# Patient Record
Sex: Female | Born: 1960 | Race: White | Hispanic: No | Marital: Married | Smoking: Never smoker
Health system: Southern US, Community
[De-identification: ages and names within clinical notes are randomized; demographics above are authoritative.]

## PROBLEM LIST (undated history)

## (undated) HISTORY — PX: CHOLECYSTECTOMY: SHX55

---

## 2005-01-04 ENCOUNTER — Ambulatory Visit: Payer: Self-pay

## 2016-05-01 ENCOUNTER — Other Ambulatory Visit: Payer: Self-pay | Admitting: Family Medicine

## 2016-05-02 ENCOUNTER — Other Ambulatory Visit: Payer: Self-pay | Admitting: Family Medicine

## 2016-05-02 DIAGNOSIS — Z1231 Encounter for screening mammogram for malignant neoplasm of breast: Secondary | ICD-10-CM

## 2016-05-22 ENCOUNTER — Ambulatory Visit
Admission: RE | Admit: 2016-05-22 | Discharge: 2016-05-22 | Disposition: A | Discharge status: Home / Self Care | Payer: BC Managed Care – PPO | Source: Ambulatory Visit | Attending: Family Medicine | Admitting: Family Medicine

## 2016-05-22 DIAGNOSIS — Z1231 Encounter for screening mammogram for malignant neoplasm of breast: Secondary | ICD-10-CM | POA: Diagnosis present

## 2017-04-25 ENCOUNTER — Other Ambulatory Visit: Payer: Self-pay | Admitting: Family Medicine

## 2017-04-27 ENCOUNTER — Other Ambulatory Visit: Payer: Self-pay | Admitting: Family Medicine

## 2017-04-27 DIAGNOSIS — Z1231 Encounter for screening mammogram for malignant neoplasm of breast: Secondary | ICD-10-CM

## 2017-05-23 ENCOUNTER — Encounter: Payer: Self-pay | Admitting: Radiology

## 2017-05-23 ENCOUNTER — Ambulatory Visit
Admission: RE | Admit: 2017-05-23 | Discharge: 2017-05-23 | Disposition: A | Discharge status: Home / Self Care | Payer: BC Managed Care – PPO | Source: Ambulatory Visit | Attending: Family Medicine | Admitting: Family Medicine

## 2017-05-23 DIAGNOSIS — Z1231 Encounter for screening mammogram for malignant neoplasm of breast: Secondary | ICD-10-CM | POA: Insufficient documentation

## 2018-05-30 ENCOUNTER — Other Ambulatory Visit: Payer: Self-pay | Admitting: Family Medicine

## 2018-05-30 DIAGNOSIS — Z1231 Encounter for screening mammogram for malignant neoplasm of breast: Secondary | ICD-10-CM

## 2018-07-10 ENCOUNTER — Other Ambulatory Visit: Payer: Self-pay

## 2018-07-10 ENCOUNTER — Ambulatory Visit
Admission: RE | Admit: 2018-07-10 | Discharge: 2018-07-10 | Disposition: A | Discharge status: Home / Self Care | Payer: BC Managed Care – PPO | Source: Ambulatory Visit | Attending: Family Medicine | Admitting: Family Medicine

## 2018-07-10 DIAGNOSIS — Z1231 Encounter for screening mammogram for malignant neoplasm of breast: Secondary | ICD-10-CM | POA: Diagnosis not present

## 2019-02-19 ENCOUNTER — Encounter: Payer: Self-pay | Admitting: Ophthalmology

## 2019-02-21 NOTE — Discharge Instructions (Signed)

## 2019-02-24 ENCOUNTER — Other Ambulatory Visit
Admission: RE | Admit: 2019-02-24 | Discharge: 2019-02-24 | Disposition: A | Discharge status: Home / Self Care | Payer: BC Managed Care – PPO | Source: Ambulatory Visit | Attending: Ophthalmology | Admitting: Ophthalmology

## 2019-02-24 ENCOUNTER — Other Ambulatory Visit: Payer: Self-pay

## 2019-02-24 DIAGNOSIS — Z01812 Encounter for preprocedural laboratory examination: Secondary | ICD-10-CM | POA: Insufficient documentation

## 2019-02-24 DIAGNOSIS — Z20822 Contact with and (suspected) exposure to covid-19: Secondary | ICD-10-CM | POA: Insufficient documentation

## 2019-02-25 LAB — SARS CORONAVIRUS 2 (TAT 6-24 HRS): SARS Coronavirus 2: NEGATIVE

## 2019-02-26 ENCOUNTER — Ambulatory Visit: Payer: BC Managed Care – PPO | Admitting: Anesthesiology

## 2019-02-26 ENCOUNTER — Ambulatory Visit
Admission: RE | Admit: 2019-02-26 | Discharge: 2019-02-26 | Disposition: A | Discharge status: Home / Self Care | Payer: BC Managed Care – PPO | Attending: Ophthalmology | Admitting: Ophthalmology

## 2019-02-26 ENCOUNTER — Encounter
Admission: RE | Disposition: A | Discharge status: Home / Self Care | Payer: Self-pay | Source: Home / Self Care | Attending: Ophthalmology

## 2019-02-26 ENCOUNTER — Other Ambulatory Visit: Payer: Self-pay

## 2019-02-26 ENCOUNTER — Encounter: Payer: Self-pay | Admitting: Ophthalmology

## 2019-02-26 DIAGNOSIS — H2511 Age-related nuclear cataract, right eye: Secondary | ICD-10-CM | POA: Insufficient documentation

## 2019-02-26 DIAGNOSIS — Z9049 Acquired absence of other specified parts of digestive tract: Secondary | ICD-10-CM | POA: Insufficient documentation

## 2019-02-26 HISTORY — PX: CATARACT EXTRACTION W/PHACO: SHX586

## 2019-02-26 SURGERY — PHACOEMULSIFICATION, CATARACT, WITH IOL INSERTION
Anesthesia: Monitor Anesthesia Care | Site: Eye | Laterality: Right

## 2019-02-26 MED ORDER — FENTANYL CITRATE (PF) 100 MCG/2ML IJ SOLN
INTRAMUSCULAR | Status: DC | PRN
  Administered 2019-02-26 (×2): 50 ug via INTRAVENOUS

## 2019-02-26 MED ORDER — LIDOCAINE HCL (PF) 2 % IJ SOLN
INTRAOCULAR | Status: DC | PRN
  Administered 2019-02-26: 10:00:00 1 mL

## 2019-02-26 MED ORDER — TETRACAINE HCL 0.5 % OP SOLN
1.0000 [drp] | OPHTHALMIC | Status: DC | PRN
  Administered 2019-02-26 (×3): 1 [drp] via OPHTHALMIC

## 2019-02-26 MED ORDER — MIDAZOLAM HCL 2 MG/2ML IJ SOLN
INTRAMUSCULAR | Status: DC | PRN
  Administered 2019-02-26 (×2): 1 mg via INTRAVENOUS

## 2019-02-26 MED ORDER — CEFUROXIME OPHTHALMIC INJECTION 1 MG/0.1 ML
INJECTION | OPHTHALMIC | Status: DC | PRN
  Administered 2019-02-26: 0.1 mL via INTRACAMERAL

## 2019-02-26 MED ORDER — EPINEPHRINE PF 1 MG/ML IJ SOLN
INTRAOCULAR | Status: DC | PRN
  Administered 2019-02-26: 10:00:00 44 mL via OPHTHALMIC

## 2019-02-26 MED ORDER — MOXIFLOXACIN HCL 0.5 % OP SOLN
1.0000 [drp] | OPHTHALMIC | Status: DC | PRN
  Administered 2019-02-26 (×3): 1 [drp] via OPHTHALMIC

## 2019-02-26 MED ORDER — BRIMONIDINE TARTRATE-TIMOLOL 0.2-0.5 % OP SOLN
OPHTHALMIC | Status: DC | PRN
  Administered 2019-02-26: 1 [drp] via OPHTHALMIC

## 2019-02-26 MED ORDER — NA HYALUR & NA CHOND-NA HYALUR 0.4-0.35 ML IO KIT
PACK | INTRAOCULAR | Status: DC | PRN
  Administered 2019-02-26: 1 mL via INTRAOCULAR

## 2019-02-26 MED ORDER — ARMC OPHTHALMIC DILATING DROPS
1.0000 "application " | OPHTHALMIC | Status: DC | PRN
  Administered 2019-02-26 (×3): 1 via OPHTHALMIC

## 2019-02-26 SURGICAL SUPPLY — 29 items
CANNULA ANT/CHMB 27G (MISCELLANEOUS) ×1 IMPLANT
CANNULA ANT/CHMB 27GA (MISCELLANEOUS) ×3 IMPLANT
GLOVE SURG LX 7.5 STRW (GLOVE) ×4
GLOVE SURG LX STRL 7.5 STRW (GLOVE) ×1 IMPLANT
GLOVE SURG TRIUMPH 8.0 PF LTX (GLOVE) ×3 IMPLANT
GOWN STRL REUS W/ TWL LRG LVL3 (GOWN DISPOSABLE) ×2 IMPLANT
GOWN STRL REUS W/TWL LRG LVL3 (GOWN DISPOSABLE) ×4
LENS IOL TECNIS ITEC 28.0 (Intraocular Lens) ×2 IMPLANT
MARKER SKIN DUAL TIP RULER LAB (MISCELLANEOUS) ×3 IMPLANT
NDL CAPSULORHEX 25GA (NEEDLE) ×1 IMPLANT
NDL FILTER BLUNT 18X1 1/2 (NEEDLE) ×2 IMPLANT
NDL RETROBULBAR .5 NSTRL (NEEDLE) IMPLANT
NEEDLE CAPSULORHEX 25GA (NEEDLE) ×3 IMPLANT
NEEDLE FILTER BLUNT 18X 1/2SAF (NEEDLE) ×4
NEEDLE FILTER BLUNT 18X1 1/2 (NEEDLE) ×2 IMPLANT
PACK CATARACT BRASINGTON (MISCELLANEOUS) ×3 IMPLANT
PACK EYE AFTER SURG (MISCELLANEOUS) ×3 IMPLANT
PACK OPTHALMIC (MISCELLANEOUS) ×3 IMPLANT
RING MALYGIN 7.0 (MISCELLANEOUS) IMPLANT
SOLUTION OPHTHALMIC SALT (MISCELLANEOUS) ×3 IMPLANT
SUT ETHILON 10-0 CS-B-6CS-B-6 (SUTURE)
SUT VICRYL  9 0 (SUTURE)
SUT VICRYL 9 0 (SUTURE) IMPLANT
SUTURE EHLN 10-0 CS-B-6CS-B-6 (SUTURE) IMPLANT
SYR 3ML LL SCALE MARK (SYRINGE) ×6 IMPLANT
SYR TB 1ML LUER SLIP (SYRINGE) ×3 IMPLANT
WATER STERILE IRR 250ML POUR (IV SOLUTION) ×3 IMPLANT
WICK EYE OCUCEL (MISCELLANEOUS) ×2 IMPLANT
WIPE NON LINTING 3.25X3.25 (MISCELLANEOUS) ×3 IMPLANT

## 2019-02-26 NOTE — Anesthesia Preprocedure Evaluation (Signed)
Anesthesia Evaluation  Patient identified by MRN, date of birth, ID band Patient awake    Reviewed: Allergy & Precautions, NPO status , Patient's Chart, lab work & pertinent test results  Airway Mallampati: II  TM Distance: >3 FB Neck ROM: Full    Dental no notable dental hx.    Pulmonary neg pulmonary ROS,    Pulmonary exam normal breath sounds clear to auscultation       Cardiovascular negative cardio ROS Normal cardiovascular exam Rhythm:Regular Rate:Normal     Neuro/Psych cataract negative psych ROS   GI/Hepatic negative GI ROS, Neg liver ROS,   Endo/Other  negative endocrine ROS  Renal/GU negative Renal ROS  negative genitourinary   Musculoskeletal negative musculoskeletal ROS (+)   Abdominal Normal abdominal exam  (+)   Peds negative pediatric ROS (+)  Hematology negative hematology ROS (+)   Anesthesia Other Findings   Reproductive/Obstetrics negative OB ROS                             Anesthesia Physical Anesthesia Plan  ASA: I  Anesthesia Plan: MAC   Post-op Pain Management:    Induction: Intravenous  PONV Risk Score and Plan: 2 and Ondansetron and Treatment may vary due to age or medical condition  Airway Management Planned: Nasal Cannula  Additional Equipment:   Intra-op Plan:   Post-operative Plan:   Informed Consent: I have reviewed the patients History and Physical, chart, labs and discussed the procedure including the risks, benefits and alternatives for the proposed anesthesia with the patient or authorized representative who has indicated his/her understanding and acceptance.       Plan Discussed with: CRNA  Anesthesia Plan Comments:         Anesthesia Quick Evaluation

## 2019-02-26 NOTE — H&P (Signed)

## 2019-02-26 NOTE — Transfer of Care (Signed)
Immediate Anesthesia Transfer of Care Note  Patient: Sierra Osborne  Procedure(s) Performed: CATARACT EXTRACTION PHACO AND INTRAOCULAR LENS PLACEMENT (IOC) RIGHT (Right Eye)  Patient Location: PACU  Anesthesia Type: MAC  Level of Consciousness: awake, alert  and patient cooperative  Airway and Oxygen Therapy: Patient Spontanous Breathing   Post-op Assessment: Post-op Vital signs reviewed, Patient's Cardiovascular Status Stable, Respiratory Function Stable, Patent Airway and No signs of Nausea or vomiting  Post-op Vital Signs: Reviewed and stable  Complications: No apparent anesthesia complications

## 2019-02-26 NOTE — Op Note (Signed)
LOCATION:  [facility]   PREOPERATIVE DIAGNOSIS:    Nuclear sclerotic cataract right eye. H25.11   POSTOPERATIVE DIAGNOSIS:  Nuclear sclerotic cataract right eye.     PROCEDURE:  Phacoemusification with posterior chamber intraocular lens placement of the right eye   ULTRASOUND TIME: Procedure(s) with comments: CATARACT EXTRACTION PHACO AND INTRAOCULAR LENS PLACEMENT (IOC) RIGHT (Right) - CDE 10.57 U/S 0:56.8 FP3 45.8%  LENS:   Implant Name Type Inv. Item Serial No. Manufacturer Lot No. LRB No. Used Action  LENS IOL DIOP 28.0 - [id number] Intraocular Lens LENS IOL DIOP 28.0 [id number]  Right 1 Implanted         SURGEON:  Deirdre Evener, MD   ANESTHESIA:  Topical with tetracaine drops and 2% Xylocaine jelly, augmented with 1% preservative-free intracameral lidocaine.    COMPLICATIONS:  None.   DESCRIPTION OF PROCEDURE:  The patient was identified in the holding room and transported to the operating room and placed in the supine position under the operating microscope.  The right eye was identified as the operative eye and it was prepped and draped in the usual sterile ophthalmic fashion.   A 1 millimeter clear-corneal paracentesis was made at the 12:00 position.  0.5 ml of preservative-free 1% lidocaine was injected into the anterior chamber. The anterior chamber was filled with Viscoat viscoelastic.  A 2.4 millimeter keratome was used to make a near-clear corneal incision at the 9:00 position.  A curvilinear capsulorrhexis was made with a cystotome and capsulorrhexis forceps.  Balanced salt solution was used to hydrodissect and hydrodelineate the nucleus.   Phacoemulsification was then used in stop and chop fashion to remove the lens nucleus and epinucleus.  The remaining cortex was then removed using the irrigation and aspiration handpiece. Provisc was then placed into the capsular bag to distend it for lens placement.  A lens was then injected into the capsular  bag.  The remaining viscoelastic was aspirated.   Wounds were hydrated with balanced salt solution.  The anterior chamber was inflated to a physiologic pressure with balanced salt solution.  No wound leaks were noted. Cefuroxime 0.1 ml of a 10mg /ml solution was injected into the anterior chamber for a dose of 1 mg of intracameral antibiotic at the completion of the case.   Timolol and Brimonidine drops were applied to the eye.  The patient was taken to the recovery room in stable condition without complications of anesthesia or surgery.   Sierra Osborne 02/26/2019, 9:59 AM

## 2019-02-26 NOTE — Anesthesia Postprocedure Evaluation (Signed)
Anesthesia Post Note  Patient: Sierra Osborne  Procedure(s) Performed: CATARACT EXTRACTION PHACO AND INTRAOCULAR LENS PLACEMENT (IOC) RIGHT (Right Eye)     Anesthesia Post Evaluation  Emmary Culbreath

## 2019-02-26 NOTE — Anesthesia Procedure Notes (Signed)
Procedure Name: MAC Date/Time: 02/26/2019 9:42 AM Performed by: Vanetta Shawl, CRNA Pre-anesthesia Checklist: Patient identified, Emergency Drugs available, Suction available, Timeout performed and Patient being monitored Patient Re-evaluated:Patient Re-evaluated prior to induction Oxygen Delivery Method: Nasal cannula Placement Confirmation: positive ETCO2

## 2019-02-27 ENCOUNTER — Encounter: Payer: Self-pay | Admitting: *Deleted

## 2019-03-08 ENCOUNTER — Ambulatory Visit: Payer: BC Managed Care – PPO | Attending: Internal Medicine

## 2019-03-08 DIAGNOSIS — Z23 Encounter for immunization: Secondary | ICD-10-CM | POA: Insufficient documentation

## 2019-03-08 NOTE — Progress Notes (Signed)
   Covid-19 Vaccination Clinic  Name:  Sierra Osborne    MRN: [id number] DOB: 1960/02/13  03/08/2019  Sierra Osborne was observed post Covid-19 immunization for 15 minutes without incident. She was provided with Vaccine Information Sheet and instruction to access the V-Safe system.   Sierra Osborne was instructed to call 911 with any severe reactions post vaccine: Marland Kitchen Difficulty breathing  . Swelling of face and throat  . A fast heartbeat  . A bad rash all over body  . Dizziness and weakness   Immunizations Administered    Name Date Dose VIS Date Route   Moderna COVID-19 Vaccine 03/08/2019  2:05 PM 0.5 mL 12/03/2018 Intramuscular   Manufacturer: Moderna   Lot: [id number]   NDC: 38177-116-57

## 2019-03-11 ENCOUNTER — Encounter: Payer: Self-pay | Admitting: Ophthalmology

## 2019-03-17 ENCOUNTER — Other Ambulatory Visit
Admission: RE | Admit: 2019-03-17 | Discharge: 2019-03-17 | Disposition: A | Discharge status: Home / Self Care | Payer: BC Managed Care – PPO | Source: Ambulatory Visit | Attending: Ophthalmology | Admitting: Ophthalmology

## 2019-03-17 DIAGNOSIS — Z20822 Contact with and (suspected) exposure to covid-19: Secondary | ICD-10-CM | POA: Insufficient documentation

## 2019-03-17 DIAGNOSIS — Z01812 Encounter for preprocedural laboratory examination: Secondary | ICD-10-CM | POA: Diagnosis present

## 2019-03-17 NOTE — Discharge Instructions (Signed)

## 2019-03-18 LAB — SARS CORONAVIRUS 2 (TAT 6-24 HRS): SARS Coronavirus 2: NEGATIVE

## 2019-03-19 ENCOUNTER — Ambulatory Visit: Payer: BC Managed Care – PPO | Admitting: Anesthesiology

## 2019-03-19 ENCOUNTER — Ambulatory Visit
Admission: RE | Admit: 2019-03-19 | Discharge: 2019-03-19 | Disposition: A | Discharge status: Home / Self Care | Payer: BC Managed Care – PPO | Attending: Ophthalmology | Admitting: Ophthalmology

## 2019-03-19 ENCOUNTER — Other Ambulatory Visit: Payer: Self-pay

## 2019-03-19 ENCOUNTER — Encounter
Admission: RE | Disposition: A | Discharge status: Home / Self Care | Payer: Self-pay | Source: Home / Self Care | Attending: Ophthalmology

## 2019-03-19 ENCOUNTER — Encounter: Payer: Self-pay | Admitting: Ophthalmology

## 2019-03-19 DIAGNOSIS — Z9849 Cataract extraction status, unspecified eye: Secondary | ICD-10-CM | POA: Diagnosis not present

## 2019-03-19 DIAGNOSIS — H2512 Age-related nuclear cataract, left eye: Secondary | ICD-10-CM | POA: Insufficient documentation

## 2019-03-19 DIAGNOSIS — Z9049 Acquired absence of other specified parts of digestive tract: Secondary | ICD-10-CM | POA: Diagnosis not present

## 2019-03-19 HISTORY — PX: CATARACT EXTRACTION W/PHACO: SHX586

## 2019-03-19 SURGERY — PHACOEMULSIFICATION, CATARACT, WITH IOL INSERTION
Anesthesia: Monitor Anesthesia Care | Site: Eye | Laterality: Left

## 2019-03-19 MED ORDER — CEFUROXIME OPHTHALMIC INJECTION 1 MG/0.1 ML
INJECTION | OPHTHALMIC | Status: DC | PRN
  Administered 2019-03-19: 0.1 mL via INTRACAMERAL

## 2019-03-19 MED ORDER — ARMC OPHTHALMIC DILATING DROPS
1.0000 "application " | OPHTHALMIC | Status: DC | PRN
  Administered 2019-03-19 (×3): 1 via OPHTHALMIC

## 2019-03-19 MED ORDER — NA HYALUR & NA CHOND-NA HYALUR 0.4-0.35 ML IO KIT
PACK | INTRAOCULAR | Status: DC | PRN
  Administered 2019-03-19: 1 mL via INTRAOCULAR

## 2019-03-19 MED ORDER — OXYCODONE HCL 5 MG/5ML PO SOLN: 5.0000 mg | Freq: Once | ORAL | Status: DC | PRN

## 2019-03-19 MED ORDER — TETRACAINE 0.5 % OP SOLN OPTIME - NO CHARGE
OPHTHALMIC | Status: DC | PRN
  Administered 2019-03-19: 2 [drp] via OPHTHALMIC

## 2019-03-19 MED ORDER — OXYCODONE HCL 5 MG PO TABS: 5.0000 mg | ORAL_TABLET | Freq: Once | ORAL | Status: DC | PRN

## 2019-03-19 MED ORDER — EPINEPHRINE PF 1 MG/ML IJ SOLN
INTRAOCULAR | Status: DC | PRN
  Administered 2019-03-19: 58 mL via OPHTHALMIC

## 2019-03-19 MED ORDER — LIDOCAINE HCL (PF) 2 % IJ SOLN
INTRAOCULAR | Status: DC | PRN
  Administered 2019-03-19: 2 mL

## 2019-03-19 MED ORDER — MIDAZOLAM HCL 2 MG/2ML IJ SOLN
INTRAMUSCULAR | Status: DC | PRN
  Administered 2019-03-19: 2 mg via INTRAVENOUS

## 2019-03-19 MED ORDER — LACTATED RINGERS IV SOLN: INTRAVENOUS | Status: DC

## 2019-03-19 MED ORDER — MOXIFLOXACIN HCL 0.5 % OP SOLN
1.0000 [drp] | OPHTHALMIC | Status: DC | PRN
  Administered 2019-03-19 (×3): 1 [drp] via OPHTHALMIC

## 2019-03-19 MED ORDER — BRIMONIDINE TARTRATE-TIMOLOL 0.2-0.5 % OP SOLN
OPHTHALMIC | Status: DC | PRN
  Administered 2019-03-19: 1 [drp] via OPHTHALMIC

## 2019-03-19 MED ORDER — TETRACAINE HCL 0.5 % OP SOLN
1.0000 [drp] | OPHTHALMIC | Status: DC | PRN
  Administered 2019-03-19 (×2): 1 [drp] via OPHTHALMIC

## 2019-03-19 MED ORDER — FENTANYL CITRATE (PF) 100 MCG/2ML IJ SOLN
INTRAMUSCULAR | Status: DC | PRN
  Administered 2019-03-19 (×2): 50 ug via INTRAVENOUS

## 2019-03-19 SURGICAL SUPPLY — 23 items
CANNULA ANT/CHMB 27G (MISCELLANEOUS) ×1 IMPLANT
CANNULA ANT/CHMB 27GA (MISCELLANEOUS) ×3 IMPLANT
GLOVE SURG LX 7.5 STRW (GLOVE) ×2
GLOVE SURG LX STRL 7.5 STRW (GLOVE) ×1 IMPLANT
GLOVE SURG TRIUMPH 8.0 PF LTX (GLOVE) ×3 IMPLANT
GOWN STRL REUS W/ TWL LRG LVL3 (GOWN DISPOSABLE) ×2 IMPLANT
GOWN STRL REUS W/TWL LRG LVL3 (GOWN DISPOSABLE) ×4
LENS IOL TECNIS ITEC 26.5 (Intraocular Lens) ×2 IMPLANT
MARKER SKIN DUAL TIP RULER LAB (MISCELLANEOUS) ×3 IMPLANT
NDL CAPSULORHEX 25GA (NEEDLE) ×1 IMPLANT
NDL FILTER BLUNT 18X1 1/2 (NEEDLE) ×2 IMPLANT
NEEDLE CAPSULORHEX 25GA (NEEDLE) ×3 IMPLANT
NEEDLE FILTER BLUNT 18X 1/2SAF (NEEDLE) ×4
NEEDLE FILTER BLUNT 18X1 1/2 (NEEDLE) ×2 IMPLANT
PACK CATARACT BRASINGTON (MISCELLANEOUS) ×3 IMPLANT
PACK EYE AFTER SURG (MISCELLANEOUS) ×3 IMPLANT
PACK OPTHALMIC (MISCELLANEOUS) ×3 IMPLANT
SOLUTION OPHTHALMIC SALT (MISCELLANEOUS) ×3 IMPLANT
SYR 3ML LL SCALE MARK (SYRINGE) ×6 IMPLANT
SYR TB 1ML LUER SLIP (SYRINGE) ×3 IMPLANT
WATER STERILE IRR 250ML POUR (IV SOLUTION) ×3 IMPLANT
WICK EYE OCUCEL (MISCELLANEOUS) ×2 IMPLANT
WIPE NON LINTING 3.25X3.25 (MISCELLANEOUS) ×3 IMPLANT

## 2019-03-19 NOTE — Anesthesia Preprocedure Evaluation (Signed)
Anesthesia Evaluation  Patient identified by MRN, date of birth, ID band Patient awake    Reviewed: NPO status   History of Anesthesia Complications Negative for: history of anesthetic complications  Airway Mallampati: II  TM Distance: >3 FB Neck ROM: full    Dental  (+) Upper Dentures, Lower Dentures   Pulmonary neg pulmonary ROS,    Pulmonary exam normal        Cardiovascular Exercise Tolerance: Good negative cardio ROS Normal cardiovascular exam     Neuro/Psych negative neurological ROS  negative psych ROS   GI/Hepatic negative GI ROS, Neg liver ROS,   Endo/Other  negative endocrine ROS  Renal/GU negative Renal ROS  negative genitourinary   Musculoskeletal   Abdominal   Peds  Hematology negative hematology ROS (+)   Anesthesia Other Findings Covid: NEG.  Had ECCE 3 weeks ago.  Reproductive/Obstetrics                             Anesthesia Physical Anesthesia Plan  ASA: I  Anesthesia Plan: MAC   Post-op Pain Management:    Induction:   PONV Risk Score and Plan: 2 and TIVA and Midazolam  Airway Management Planned:   Additional Equipment:   Intra-op Plan:   Post-operative Plan:   Informed Consent: I have reviewed the patients History and Physical, chart, labs and discussed the procedure including the risks, benefits and alternatives for the proposed anesthesia with the patient or authorized representative who has indicated his/her understanding and acceptance.       Plan Discussed with: CRNA  Anesthesia Plan Comments:         Anesthesia Quick Evaluation

## 2019-03-19 NOTE — Anesthesia Procedure Notes (Signed)
Procedure Name: MAC Date/Time: 03/19/2019 12:04 PM Performed by: Jeannene Patella, CRNA Pre-anesthesia Checklist: Patient identified, Emergency Drugs available, Suction available, Patient being monitored and Timeout performed Patient Re-evaluated:Patient Re-evaluated prior to induction Oxygen Delivery Method: Nasal cannula

## 2019-03-19 NOTE — H&P (Signed)

## 2019-03-19 NOTE — Anesthesia Postprocedure Evaluation (Signed)
Anesthesia Post Note  Patient: Sierra Osborne  Procedure(s) Performed: CATARACT EXTRACTION PHACO AND INTRAOCULAR LENS PLACEMENT (IOC) LEFT 8.20 00:59.7 13.8% (Left Eye)     Patient location during evaluation: PACU Anesthesia Type: MAC Level of consciousness: awake and alert Pain management: pain level controlled Vital Signs Assessment: post-procedure vital signs reviewed and stable Respiratory status: spontaneous breathing, nonlabored ventilation, respiratory function stable and patient connected to nasal cannula oxygen Cardiovascular status: stable and blood pressure returned to baseline Postop Assessment: no apparent nausea or vomiting Anesthetic complications: no    Perrin Eddleman

## 2019-03-19 NOTE — Op Note (Signed)
OPERATIVE NOTE  Michela Herst [id number] 03/19/2019   PREOPERATIVE DIAGNOSIS:  Nuclear sclerotic cataract left eye. H25.12   POSTOPERATIVE DIAGNOSIS:    Nuclear sclerotic cataract left eye.     PROCEDURE:  Phacoemusification with posterior chamber intraocular lens placement of the left eye  Ultrasound time: Procedure(s): CATARACT EXTRACTION PHACO AND INTRAOCULAR LENS PLACEMENT (IOC) LEFT 8.20 00:59.7 13.8% (Left)  LENS:   Implant Name Type Inv. Item Serial No. Manufacturer Lot No. LRB No. Used Action  LENS IOL DIOP 26.5 - [id number] Intraocular Lens LENS IOL DIOP 26.5 [id number]  Left 1 Implanted      SURGEON:  Deirdre Evener, MD   ANESTHESIA:  Topical with tetracaine drops and 2% Xylocaine jelly, augmented with 1% preservative-free intracameral lidocaine.    COMPLICATIONS:  None.   DESCRIPTION OF PROCEDURE:  The patient was identified in the holding room and transported to the operating room and placed in the supine position under the operating microscope.  The left eye was identified as the operative eye and it was prepped and draped in the usual sterile ophthalmic fashion.   A 1 millimeter clear-corneal paracentesis was made at the 1:30 position.  0.5 ml of preservative-free 1% lidocaine was injected into the anterior chamber.  The anterior chamber was filled with Viscoat viscoelastic.  A 2.4 millimeter keratome was used to make a near-clear corneal incision at the 10:30 position.  .  A curvilinear capsulorrhexis was made with a cystotome and capsulorrhexis forceps.  Balanced salt solution was used to hydrodissect and hydrodelineate the nucleus.   Phacoemulsification was then used in stop and chop fashion to remove the lens nucleus and epinucleus.  The remaining cortex was then removed using the irrigation and aspiration handpiece. Provisc was then placed into the capsular bag to distend it for lens placement.  A lens was then injected into the capsular bag.  The remaining  viscoelastic was aspirated.   Wounds were hydrated with balanced salt solution.  The anterior chamber was inflated to a physiologic pressure with balanced salt solution.  No wound leaks were noted. Cefuroxime 0.1 ml of a 10mg /ml solution was injected into the anterior chamber for a dose of 1 mg of intracameral antibiotic at the completion of the case.   Timolol and Brimonidine drops were applied to the eye.  The patient was taken to the recovery room in stable condition without complications of anesthesia or surgery.  Keven Soucy 03/19/2019, 12:23 PM

## 2019-03-19 NOTE — Transfer of Care (Signed)
Immediate Anesthesia Transfer of Care Note  Patient: Sierra Osborne  Procedure(s) Performed: CATARACT EXTRACTION PHACO AND INTRAOCULAR LENS PLACEMENT (IOC) LEFT 8.20 00:59.7 13.8% (Left Eye)  Patient Location: PACU  Anesthesia Type: MAC  Level of Consciousness: awake, alert  and patient cooperative  Airway and Oxygen Therapy: Patient Spontanous Breathing and Patient connected to supplemental oxygen  Post-op Assessment: Post-op Vital signs reviewed, Patient's Cardiovascular Status Stable, Respiratory Function Stable, Patent Airway and No signs of Nausea or vomiting  Post-op Vital Signs: Reviewed and stable  Complications: No apparent anesthesia complications

## 2019-03-20 ENCOUNTER — Encounter: Payer: Self-pay | Admitting: *Deleted

## 2019-04-05 ENCOUNTER — Ambulatory Visit: Payer: BC Managed Care – PPO | Attending: Internal Medicine

## 2019-04-05 DIAGNOSIS — Z23 Encounter for immunization: Secondary | ICD-10-CM

## 2019-04-05 NOTE — Progress Notes (Signed)
   Covid-19 Vaccination Clinic  Name:  Javier Mamone    MRN: [id number] DOB: Aug 16, 1960  04/05/2019  Ms. Ratterman was observed post Covid-19 immunization for 15 minutes without incident. She was provided with Vaccine Information Sheet and instruction to access the V-Safe system.   Ms. Pottle was instructed to call 911 with any severe reactions post vaccine: Marland Kitchen Difficulty breathing  . Swelling of face and throat  . A fast heartbeat  . A bad rash all over body  . Dizziness and weakness   Immunizations Administered    Name Date Dose VIS Date Route   Moderna COVID-19 Vaccine 04/05/2019  9:44 AM 0.5 mL 12/03/2018 Intramuscular   Manufacturer: Gala Murdoch   Lot: [id number]   NDC: 04591-368-59

## 2019-05-26 ENCOUNTER — Other Ambulatory Visit: Payer: Self-pay | Admitting: Family Medicine

## 2019-05-26 DIAGNOSIS — Z1231 Encounter for screening mammogram for malignant neoplasm of breast: Secondary | ICD-10-CM

## 2019-07-11 ENCOUNTER — Ambulatory Visit
Admission: RE | Admit: 2019-07-11 | Discharge: 2019-07-11 | Disposition: A | Discharge status: Home / Self Care | Payer: BC Managed Care – PPO | Source: Ambulatory Visit | Attending: Family Medicine | Admitting: Family Medicine

## 2019-07-11 DIAGNOSIS — Z1231 Encounter for screening mammogram for malignant neoplasm of breast: Secondary | ICD-10-CM

## 2020-05-19 ENCOUNTER — Other Ambulatory Visit: Payer: Self-pay | Admitting: Internal Medicine

## 2020-05-19 ENCOUNTER — Other Ambulatory Visit: Payer: Self-pay | Admitting: Family Medicine

## 2020-05-19 DIAGNOSIS — Z1231 Encounter for screening mammogram for malignant neoplasm of breast: Secondary | ICD-10-CM

## 2020-07-12 ENCOUNTER — Ambulatory Visit
Admission: RE | Admit: 2020-07-12 | Discharge: 2020-07-12 | Disposition: A | Discharge status: Home / Self Care | Payer: BC Managed Care – PPO | Source: Ambulatory Visit | Attending: Family Medicine | Admitting: Family Medicine

## 2020-07-12 ENCOUNTER — Other Ambulatory Visit: Payer: Self-pay

## 2020-07-12 DIAGNOSIS — Z1231 Encounter for screening mammogram for malignant neoplasm of breast: Secondary | ICD-10-CM

## 2021-05-26 ENCOUNTER — Other Ambulatory Visit: Payer: Self-pay | Admitting: Family Medicine

## 2021-05-26 DIAGNOSIS — Z1231 Encounter for screening mammogram for malignant neoplasm of breast: Secondary | ICD-10-CM

## 2021-06-13 LAB — EXTERNAL GENERIC LAB PROCEDURE: COLOGUARD: NEGATIVE

## 2021-06-13 LAB — COLOGUARD: COLOGUARD: NEGATIVE

## 2021-07-13 ENCOUNTER — Ambulatory Visit
Admission: RE | Admit: 2021-07-13 | Discharge: 2021-07-13 | Disposition: A | Discharge status: Home / Self Care | Payer: BC Managed Care – PPO | Source: Ambulatory Visit | Attending: Family Medicine | Admitting: Family Medicine

## 2021-07-13 DIAGNOSIS — Z1231 Encounter for screening mammogram for malignant neoplasm of breast: Secondary | ICD-10-CM | POA: Insufficient documentation

## 2021-09-12 ENCOUNTER — Ambulatory Visit: Payer: BC Managed Care – PPO | Admitting: Dermatology

## 2021-09-12 DIAGNOSIS — L820 Inflamed seborrheic keratosis: Secondary | ICD-10-CM | POA: Diagnosis not present

## 2021-09-12 DIAGNOSIS — L821 Other seborrheic keratosis: Secondary | ICD-10-CM | POA: Diagnosis not present

## 2021-09-12 DIAGNOSIS — R202 Paresthesia of skin: Secondary | ICD-10-CM | POA: Diagnosis not present

## 2021-09-12 NOTE — Patient Instructions (Addendum)
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 This is usually accompanied by chronic rubbing or scratching often leaving the area of skin discolored and thickened. There is no cure, but there are some treatments which may help control the itch.   Over the counter (non-prescription) treatments for notalgia paresthetica include numbing creams like pramoxine or lidocaine which temporarily reduce itch or Capsaicin-containing creams which cause a burning sensation but which sometimes over time will reset the nerves to stop producing itch.  If you choose to use Capsaicin cream, it is recommended to use it 5 times daily for 1 week followed by 3 times daily for 3-6 weeks. You may have to continue using it long-term. For severe cases, there are some prescription cream or pill options which may help. Other treatment options include: - Transcutaneous Electrical Nerve Stimulation (TENS) - Gabapentin 300-900 mg daily po - Amitriptyline orally - Paravertebral local anesthetic block - intralesional Botulinum toxin A  Recommend OTC Gold Bond Rapid Relief Anti-Itch cream (pramoxine + menthol), CeraVe Anti-itch cream or lotion (pramoxine), Sarna lotion (Original- menthol + camphor or Sensitive- pramoxine) or Eucerin 12 hour Itch Relief lotion (menthol) up to 3 times per day to areas on body that are itchy.   Cryotherapy Aftercare  Wash gently with soap and water everyday.   Apply Vaseline and Band-Aid daily until healed.   Seborrheic Keratosis  What causes seborrheic keratoses? Seborrheic keratoses are harmless, common skin growths that first appear during adult life.  As time goes by, more growths appear.  Some people may develop a large number of them.  Seborrheic keratoses appear on both covered and uncovered body parts.  They are not caused by sunlight.  The tendency to develop  seborrheic keratoses can be inherited.  They vary in color from skin-colored to gray, brown, or even black.  They can be either smooth or have a rough, warty surface.   Seborrheic keratoses are superficial and look as if they were stuck on the skin.  Under the microscope this type of keratosis looks like layers upon layers of skin.  That is why at times the top layer may seem to fall off, but the rest of the growth remains and re-grows.    Treatment Seborrheic keratoses do not need to be treated, but can easily be removed in the office.  Seborrheic keratoses often cause symptoms when they rub on clothing or jewelry.  Lesions can be in the way of shaving.  If they become inflamed, they can cause itching, soreness, or burning.  Removal of a seborrheic keratosis can be accomplished by freezing, burning, or surgery. If any spot bleeds, scabs, or grows rapidly, please return to have it checked, as these can be an indication of a skin cancer.  Due to recent changes in healthcare laws, you may see results of your pathology and/or laboratory studies on MyChart before the doctors have had a chance to review them. We understand that in some cases there may be results that are confusing or concerning to you. Please understand that not all results are received at the same time and often the doctors may need to interpret multiple results in order to provide you with the best plan of care or course of treatment. Therefore, we ask that you please give [place] 2 business days to thoroughly review all your results before contacting the office for  clarification. Should we see a critical lab result, you will be contacted sooner.   If You Need Anything After Your Visit  If you have any questions or concerns for your doctor, please call our main line at [phone] and press option 4 to reach your doctor's medical assistant. If no one answers, please leave a voicemail as directed and we will return your call as soon as possible.  Messages left after 4 pm will be answered the following business day.   You may also send [place] a message via MyChart. We typically respond to MyChart messages within 1-2 business days.  For prescription refills, please ask your pharmacy to contact our office. Our fax number is [phone].  If you have an urgent issue when the clinic is closed that cannot wait until the next business day, you can page your doctor at the number below.    Please note that while we do our best to be available for urgent issues outside of office hours, we are not available 24/7.   If you have an urgent issue and are unable to reach [place], you may choose to seek medical care at your doctor's office, retail clinic, urgent care center, or emergency room.  If you have a medical emergency, please immediately call 911 or go to the emergency department.  Pager Numbers  - Dr. Gwen Pounds: [phone]  - Dr. Neale Burly: [phone]  - Dr. Roseanne Reno: [phone]  In the event of inclement weather, please call our main line at [phone] for an update on the status of any delays or closures.  Dermatology Medication Tips: Please keep the boxes that topical medications come in in order to help keep track of the instructions about where and how to use these. Pharmacies typically print the medication instructions only on the boxes and not directly on the medication tubes.   If your medication is too expensive, please contact our office at [phone] option 4 or send [place] a message through MyChart.   We are unable to tell what your co-pay for medications will be in advance as this is different depending on your insurance coverage. However, we may be able to find a substitute medication at lower cost or fill out paperwork to get insurance to cover a needed medication.   If a prior authorization is required to get your medication covered by your insurance company, please allow [place] 1-2 business days to complete this process.  Drug  prices often vary depending on where the prescription is filled and some pharmacies may offer cheaper prices.  The website www.goodrx.com contains coupons for medications through different pharmacies. The prices here do not account for what the cost may be with help from insurance (it may be cheaper with your insurance), but the website can give you the price if you did not use any insurance.  - You can print the associated coupon and take it with your prescription to the pharmacy.  - You may also stop by our office during regular business hours and pick up a GoodRx coupon card.  - If you need your prescription sent electronically to a different pharmacy, notify our office through [facility] or by phone at [phone] option 4.     Si Usted Necesita Algo Despus de Su Visita  Tambin puede enviarnos un mensaje a travs de Clinical cytogeneticist. Por lo general respondemos a los mensajes de MyChart en el transcurso de 1 a 2 das hbiles.  Para renovar recetas, por favor pida a su farmacia que se  ponga en contacto con nuestra oficina. Annie Sable de fax es [place] [phone].  Si tiene un asunto urgente cuando la clnica est cerrada y que no puede esperar hasta el siguiente da hbil, puede llamar/localizar a su doctor(a) al nmero que aparece a continuacin.   Por favor, tenga en cuenta que aunque hacemos todo lo posible para estar disponibles para asuntos urgentes fuera del horario de [place], no estamos disponibles las 24 horas del da, los 7 [address]  [address] de la [place].   Si tiene un problema urgente y no puede comunicarse con nosotros, puede optar por buscar atencin mdica  en el consultorio de su doctor(a), en una clnica privada, en un centro de atencin urgente o en una sala de emergencias.  Si tiene Engineer, drilling, por favor llame inmediatamente al 911 o vaya a la sala de emergencias.  Nmeros de bper  - Dr. Gwen Pounds: [phone]  - Dra. Moye: [phone]  - Dra. Roseanne Reno:  [phone]  En caso de inclemencias del [place], por favor llame a Lacy Duverney principal al [phone] para una actualizacin sobre el [place] de cualquier retraso o cierre.  Consejos para la medicacin en dermatologa: Por favor, guarde las cajas en las que vienen los medicamentos de uso tpico para ayudarle a seguir las instrucciones sobre dnde y cmo usarlos. Las farmacias generalmente imprimen las instrucciones del medicamento slo en las cajas y no directamente en los tubos del [place].   Si su medicamento es muy caro, por favor, pngase en contacto con Rolm Gala llamando al [phone] y presione la opcin 4 o envenos un mensaje a travs de Clinical cytogeneticist.   No podemos decirle cul ser su copago por los medicamentos por adelantado ya que esto es diferente dependiendo de la cobertura de su seguro. Sin embargo, es posible que podamos encontrar un medicamento sustituto a Audiological scientist un formulario para que el seguro cubra el medicamento que se considera necesario.   Si se requiere una autorizacin previa para que su compaa de seguros [place] su medicamento, por favor permtanos de 1 a 2 das hbiles para completar [address]  Los precios de los medicamentos varan con frecuencia dependiendo del Environmental consultant de dnde se surte la receta y alguna farmacias pueden ofrecer precios ms baratos.  El sitio web www.goodrx.com tiene cupones para medicamentos de Health and safety inspector. Los precios aqu no tienen en cuenta lo que podra costar con la ayuda del seguro (puede ser ms barato con su seguro), pero el sitio web puede darle el precio si no utiliz Tourist information centre manager.  - Puede imprimir el cupn correspondiente y llevarlo con su receta a la farmacia.  - Tambin puede pasar por nuestra oficina durante el horario de atencin regular y Education officer, museum una tarjeta de cupones de GoodRx.  - Si necesita que su receta se enve electrnicamente a una farmacia diferente, informe a nuestra oficina a travs de  MyChart de [place] o por telfono llamando al [phone] y presione la opcin 4.

## 2021-09-12 NOTE — Progress Notes (Signed)
New Patient Visit  Subjective  Sierra Osborne is a 61 y.o. female who presents for the following: New Patient (Initial Visit).  Patient has a growth under her left eye that came up around 3 months ago, itchy at times. Two weeks ago the dark area of growth came off, now pink area there. Area gets in the way of her vision. She also has a couple of itchy spots on her back she would like checked. Patient has a history of shingles in this area several years ago. She also has growths on the left axilla, no symptoms.   The following portions of the chart were reviewed this encounter and updated as appropriate:       Review of Systems:  No other skin or systemic complaints except as noted in HPI or Assessment and Plan.  Objective  Well appearing patient in no apparent distress; mood and affect are within normal limits.  A focused examination was performed including face, back. Relevant physical exam findings are noted in the Assessment and Plan.  Left Infraocular Erythematous stuck-on, waxy papule  Right medial scapula Clear today.    Assessment & Plan  Seborrheic Keratoses - Stuck-on, waxy, tan-brown papules and/or plaques,  left axilla - Benign-appearing - Discussed benign etiology and prognosis. - Observe - Call for any changes  Inflamed seborrheic keratosis Left Infraocular  Symptomatic, irritating, patient would like treated.  Destruction of lesion - Left Infraocular  Destruction method: cryotherapy   Informed consent: discussed and consent obtained   Lesion destroyed using liquid nitrogen: Yes   Region frozen until ice ball extended beyond lesion: Yes   Outcome: patient tolerated procedure well with no complications   Post-procedure details: wound care instructions given   Additional details:  Prior to procedure, discussed risks of blister formation, small wound, skin dyspigmentation, or rare scar following cryotherapy. Recommend Vaseline ointment to treated areas while  healing.   Notalgia paresthetica Right medial scapula  Notalgia paresthetica is a chronic condition affecting the skin of the back in which a pinched nerve along the spine causes itching or changes in sensation in an area of skin. This is usually accompanied by chronic rubbing or scratching often leaving the area of skin discolored and thickened. There is no cure, but there are some treatments which may help control the itch.   Over the counter (non-prescription) treatments for notalgia paresthetica include numbing creams like pramoxine or lidocaine which temporarily reduce itch or Capsaicin-containing creams which cause a burning sensation but which sometimes over time will reset the nerves to stop producing itch.  If you choose to use Capsaicin cream, it is recommended to use it 5 times daily for 1 week followed by 3 times daily for 3-6 weeks. You may have to continue using it long-term. For severe cases, there are some prescription cream or pill options which may help. Other treatment options include: - Transcutaneous Electrical Nerve Stimulation (TENS) - Gabapentin 300-900 mg daily po - Amitriptyline orally - Paravertebral local anesthetic block - intralesional Botulinum toxin A  Recommend OTC Gold Bond Rapid Relief Anti-Itch cream (pramoxine + menthol), CeraVe Anti-itch cream or lotion (pramoxine), Sarna lotion (Original- menthol + camphor or Sensitive- pramoxine) or Eucerin 12 hour Itch Relief lotion (menthol) up to 3 times per day to areas on body that are itchy.    Return if symptoms worsen or fail to improve.  Wendee Beavers, CMA, am acting as scribe for Willeen Niece, MD .  Documentation: I have reviewed the above documentation  for accuracy and completeness, and I agree with the above.  Brendolyn Patty MD

## 2021-11-10 IMAGING — MG MM DIGITAL SCREENING BILAT W/ TOMO AND CAD
8 series · 9 of 24 positions shown · non-contrast
Comparison: Previous exam(s).

CLINICAL DATA: Screening.

EXAM:
DIGITAL SCREENING BILATERAL MAMMOGRAM WITH TOMOSYNTHESIS AND CAD
TECHNIQUE: Bilateral screening digital craniocaudal and mediolateral oblique
mammograms were obtained. Bilateral screening digital breast
tomosynthesis was performed. The images were evaluated with
computer-aided detection.

[L CC synth-2D]
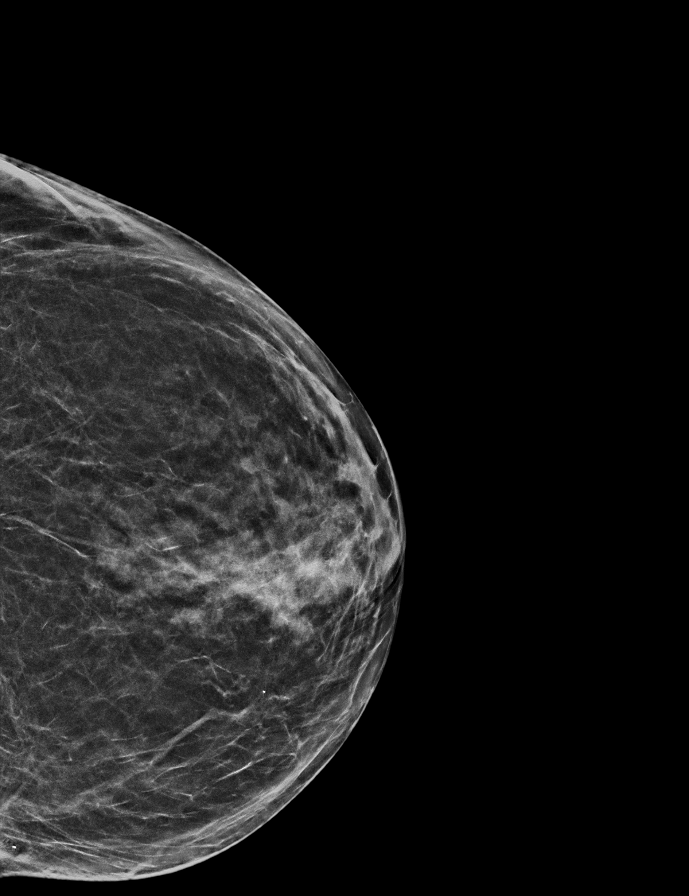

[R MLO synth-2D]
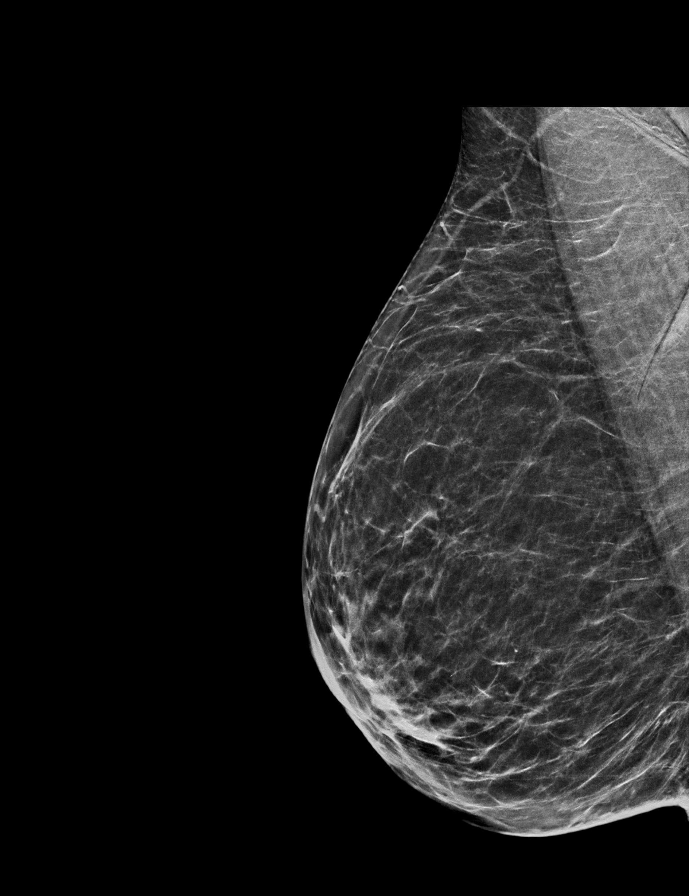

[L MLO synth-2D]
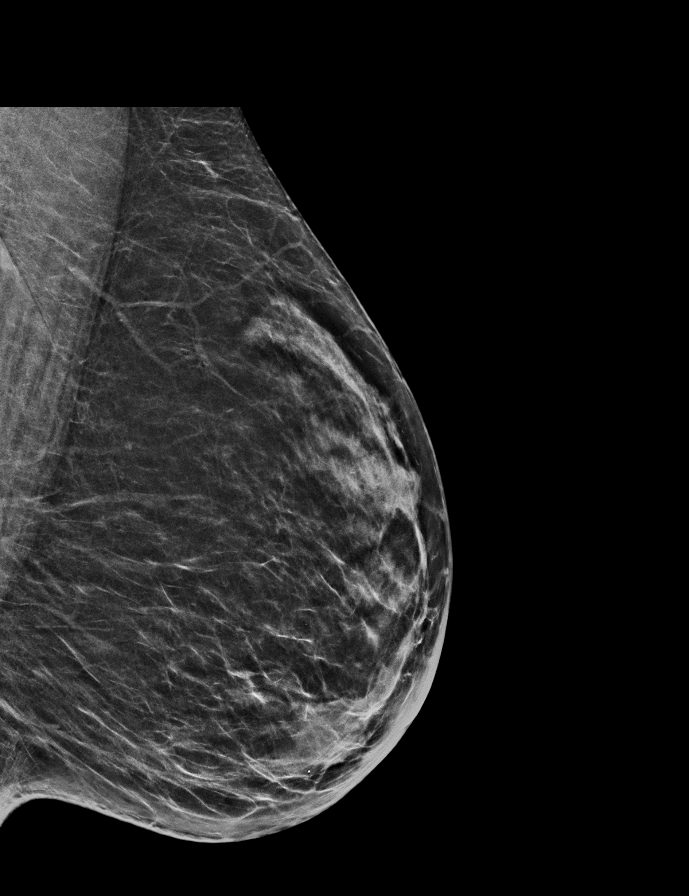

[R CC synth-2D]
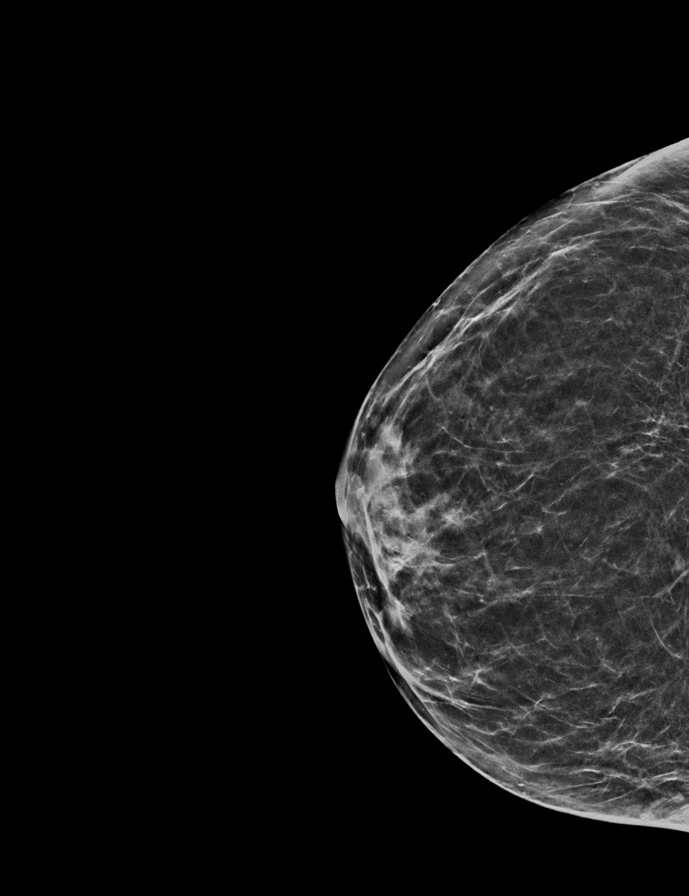

[L MLO tomo · 2 of 53 frames shown]
[frame 18/53]
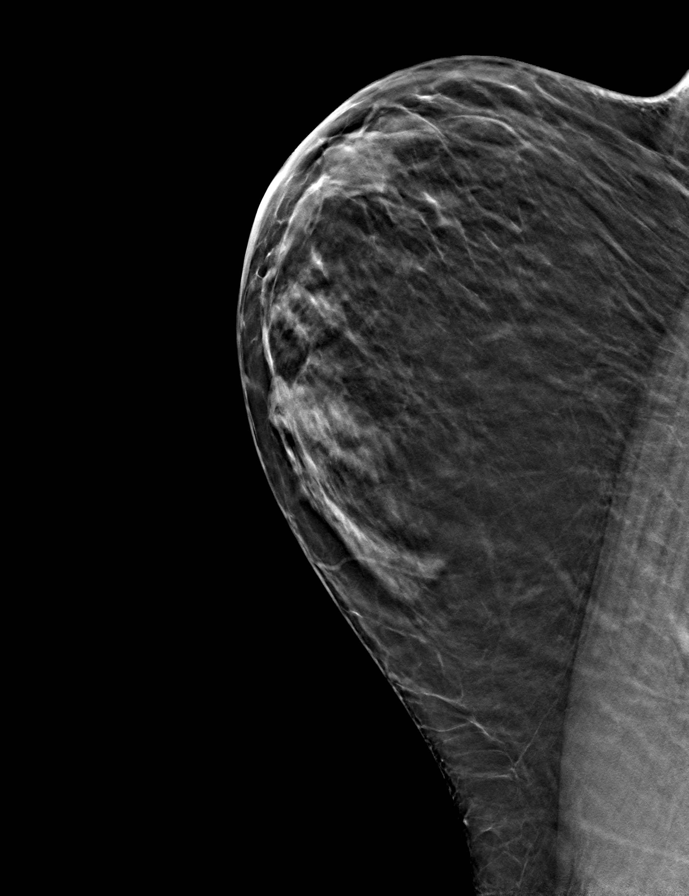
[frame 27/53]
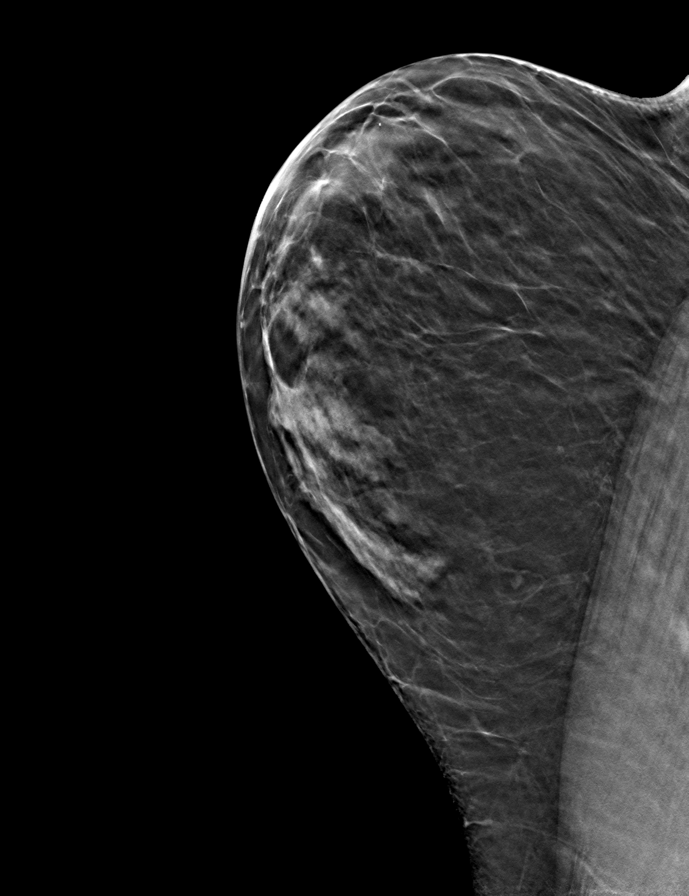

[L CC tomo · tomo slice 27/52.0]
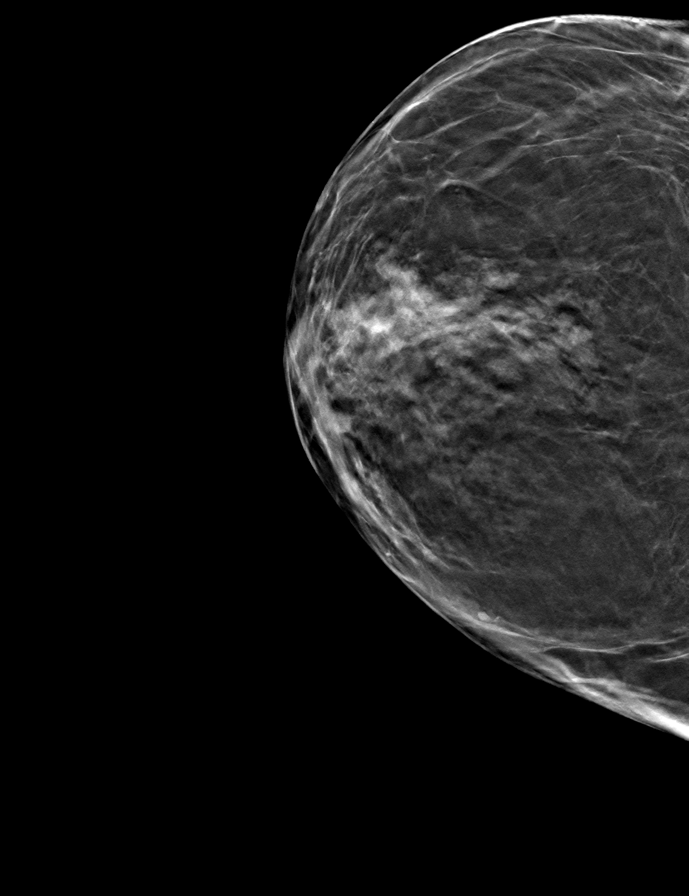

[R CC tomo · tomo slice 25/50.0]
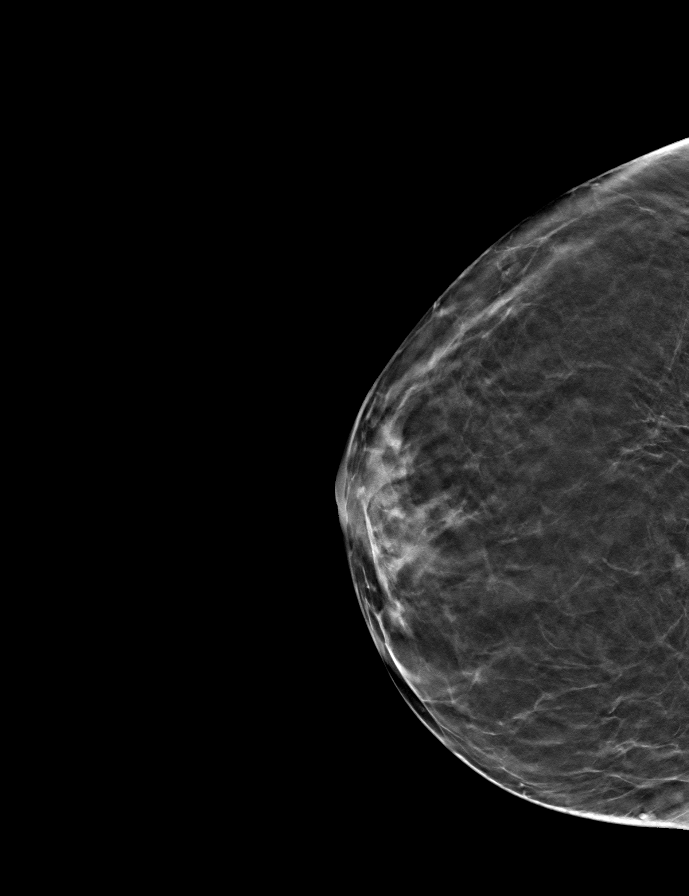

[R MLO tomo · tomo slice 27/53.0]
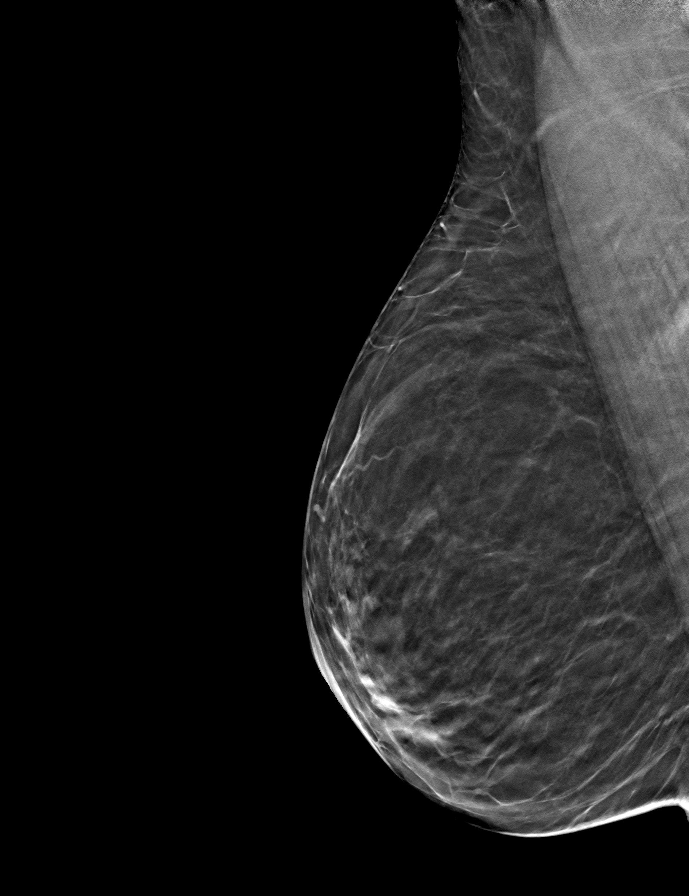

[9 of 24 positions shown; findings below may reference images not displayed]

ACR Breast Density Category b: There are scattered areas of
fibroglandular density.
FINDINGS: There are no findings suspicious for malignancy.
IMPRESSION: No mammographic evidence of malignancy. A result letter of this
screening mammogram will be mailed directly to the patient.

RECOMMENDATION:
Screening mammogram in one year. (Code:51-O-LD2)

BI-RADS CATEGORY  1: Negative.

## 2022-05-30 ENCOUNTER — Other Ambulatory Visit: Payer: Self-pay | Admitting: Family Medicine

## 2022-05-30 DIAGNOSIS — Z1231 Encounter for screening mammogram for malignant neoplasm of breast: Secondary | ICD-10-CM

## 2022-07-18 ENCOUNTER — Ambulatory Visit
Admission: RE | Admit: 2022-07-18 | Discharge: 2022-07-18 | Disposition: A | Discharge status: Home / Self Care | Payer: BC Managed Care – PPO | Source: Ambulatory Visit | Attending: Family Medicine | Admitting: Family Medicine

## 2022-07-18 DIAGNOSIS — Z1231 Encounter for screening mammogram for malignant neoplasm of breast: Secondary | ICD-10-CM | POA: Diagnosis not present

## 2023-05-31 ENCOUNTER — Other Ambulatory Visit: Payer: Self-pay | Admitting: Family Medicine

## 2023-05-31 DIAGNOSIS — Z1231 Encounter for screening mammogram for malignant neoplasm of breast: Secondary | ICD-10-CM

## 2023-07-24 ENCOUNTER — Ambulatory Visit
Admission: RE | Admit: 2023-07-24 | Discharge: 2023-07-24 | Disposition: A | Discharge status: Home / Self Care | Payer: Self-pay | Source: Ambulatory Visit | Attending: Family Medicine | Admitting: Family Medicine

## 2023-07-24 DIAGNOSIS — Z1231 Encounter for screening mammogram for malignant neoplasm of breast: Secondary | ICD-10-CM | POA: Insufficient documentation
